# Patient Record
Sex: Male | Born: 1990 | Race: Black or African American | Hispanic: No | Marital: Single
Health system: Southern US, Community
[De-identification: ages and names within clinical notes are randomized; demographics above are authoritative.]

---

## 2021-02-10 ENCOUNTER — Other Ambulatory Visit: Payer: Self-pay

## 2021-02-10 ENCOUNTER — Emergency Department (HOSPITAL_COMMUNITY)
Admission: EM | Admit: 2021-02-10 | Discharge: 2021-02-11 | Disposition: A | Discharge status: Home / Self Care | Payer: Self-pay | Attending: Emergency Medicine | Admitting: Emergency Medicine

## 2021-02-10 ENCOUNTER — Encounter (HOSPITAL_COMMUNITY): Payer: Self-pay | Admitting: Emergency Medicine

## 2021-02-10 DIAGNOSIS — L0291 Cutaneous abscess, unspecified: Secondary | ICD-10-CM

## 2021-02-10 DIAGNOSIS — L02214 Cutaneous abscess of groin: Secondary | ICD-10-CM | POA: Insufficient documentation

## 2021-02-10 MED ORDER — HYDROCODONE-ACETAMINOPHEN 5-325 MG PO TABS
2.0000 | ORAL_TABLET | Freq: Once | ORAL | Status: AC
Start: 2021-02-10 — End: 2021-02-10
  Administered 2021-02-10: 2 via ORAL
  Filled 2021-02-10: qty 2

## 2021-02-10 MED ORDER — LIDOCAINE-EPINEPHRINE (PF) 2 %-1:200000 IJ SOLN
10.0000 mL | Freq: Once | INTRAMUSCULAR | Status: AC
  Administered 2021-02-10: 10 mL
  Filled 2021-02-10: qty 20

## 2021-02-10 NOTE — ED Triage Notes (Signed)
Pt reports a "hair bump that turned in to an abscess" on his L upper groin for about 4 days. States that he went to urgent care for same on Thursday. No fever.

## 2021-02-10 NOTE — ED Provider Notes (Signed)
MSE was initiated and I personally evaluated the patient and placed orders (if any) at  10:27 PM on February 10, 2021.  Patient with abscess in upper groin.  No fever.  No vomiting.  Has been on antibiotics prescribed by urgent care with no improvement.  Discussed with patient that their care has been initiated.   They are counseled that they will need to remain in the ED until the completion of their workup, including full H&P and results of any tests.  Risks of leaving the emergency department prior to completion of treatment were discussed. Patient was advised to inform ED staff if they are leaving before their treatment is complete. The patient acknowledged these risks and time was allowed for questions.    The patient appears stable so that the remainder of the MSE may be completed by another provider.    Roxy Horseman, PA-C 02/10/21 2227    Milagros Loll, MD 02/12/21 [phone]

## 2021-02-11 MED ORDER — OXYCODONE-ACETAMINOPHEN 5-325 MG PO TABS: 1.0000 | ORAL_TABLET | Freq: Three times a day (TID) | ORAL | 0 refills | Status: DC | PRN

## 2021-02-11 MED ORDER — DOXYCYCLINE HYCLATE 100 MG PO TABS
100.0000 mg | ORAL_TABLET | Freq: Once | ORAL | Status: AC
  Administered 2021-02-11: 100 mg via ORAL
  Filled 2021-02-11: qty 1

## 2021-02-11 MED ORDER — DOXYCYCLINE HYCLATE 100 MG PO CAPS: 100.0000 mg | ORAL_CAPSULE | Freq: Two times a day (BID) | ORAL | 0 refills | Status: AC

## 2021-02-11 MED ORDER — HYDROCODONE-ACETAMINOPHEN 5-325 MG PO TABS
1.0000 | ORAL_TABLET | Freq: Once | ORAL | Status: AC
  Administered 2021-02-11: 1 via ORAL
  Filled 2021-02-11: qty 1

## 2021-02-11 NOTE — ED Provider Notes (Signed)
[facility]-EMERGENCY DEPT Provider Note   CSN: [id number] Arrival date & time: 02/10/21  2043     History Chief Complaint  Patient presents with  . Abscess    Dakota Reed is a 30 y.o. male presenting for evaluation of groin abscess.  Patient states for the past week he has pain and swelling in his left groin.  No fevers, chills, nausea, vomiting, abdominal pain.  He was seen in urgent care several days ago, at that time it is too small to drain.  He is put on antibiotics, but states he did not like the side effects of stopped taking them.  He is not using anything including Tylenol, ibuprofen, warm compresses.  No history of similar.  He has no other medical problems, takes no medications daily.  No difficulty urinating.  HPI     History reviewed. No pertinent past medical history.  There are no problems to display for this patient.    History reviewed. No pertinent family history.     Home Medications Prior to Admission medications   Medication Sig Start Date End Date Taking? Authorizing Provider  doxycycline (VIBRAMYCIN) 100 MG capsule Take 1 capsule (100 mg total) by mouth 2 (two) times daily for 7 days. 02/11/21 02/18/21 Yes Susano Cleckler, PA-C  oxyCODONE-acetaminophen (PERCOCET/ROXICET) 5-325 MG tablet Take 1 tablet by mouth every 8 (eight) hours as needed for severe pain. 02/11/21  Yes Aujanae Mccullum, PA-C    Allergies    Patient has no known allergies.  Review of Systems   Review of Systems  Genitourinary:       Left groin pain and swelling  All other systems reviewed and are negative.   Physical Exam Updated Vital Signs BP (!) 144/87   Pulse 88   Temp 98.2 F (36.8 C) (Oral)   Resp 19   Ht 5\' 9"  (1.753 m)   Wt 68 kg   SpO2 98%   BMI 22.15 kg/m   Physical Exam Vitals and nursing note reviewed.  Constitutional:      General: He is not in acute distress.    Appearance: He is well-developed.  HENT:     Head:  Normocephalic and atraumatic.  Cardiovascular:     Rate and Rhythm: Normal rate and regular rhythm.     Pulses: Normal pulses.  Pulmonary:     Effort: Pulmonary effort is normal.     Breath sounds: Normal breath sounds.  Abdominal:     General: There is no distension.     Palpations: Abdomen is soft. There is no mass.     Tenderness: There is no abdominal tenderness. There is no guarding or rebound.  Genitourinary:      Comments: Large tender and fluctuant area of the left groin.  No tenderness palpation elsewhere in the lower abdomen or upper thigh.  No crepitus. Musculoskeletal:        General: Normal range of motion.     Cervical back: Normal range of motion.  Skin:    General: Skin is warm.     Capillary Refill: Capillary refill takes less than 2 seconds.     Findings: No rash.  Neurological:     Mental Status: He is alert and oriented to person, place, and time.     ED Results / Procedures / Treatments   Labs (all labs ordered are listed, but only abnormal results are displayed) Labs Reviewed - No data to display  EKG None  Radiology No results found.  Procedures .Marland KitchenIncision and Drainage  Date/Time: 02/11/2021 12:32 AM Performed by: Alveria Apley, PA-C Authorized by: Alveria Apley, PA-C   Consent:    Consent obtained:  Verbal   Consent given by:  Patient   Risks discussed:  Bleeding, incomplete drainage, pain, infection and damage to other organs Location:    Type:  Abscess   Location:  Anogenital   Anogenital location: Left inguinal area. Pre-procedure details:    Skin preparation:  Antiseptic wash Anesthesia:    Anesthesia method:  Local infiltration   Local anesthetic:  Lidocaine 2% WITH epi Procedure type:    Complexity:  Simple Procedure details:    Incision types:  Single straight   Incision depth:  Dermal   Wound management:  Irrigated with saline and probed and deloculated   Drainage:  Purulent   Drainage amount:  Moderate   Wound  treatment:  Wound left open   Packing materials:  None Post-procedure details:    Procedure completion:  Tolerated well, no immediate complications     Medications Ordered in ED Medications  lidocaine-EPINEPHrine (XYLOCAINE W/EPI) 2 %-1:200000 (PF) injection 10 mL (10 mLs Infiltration Given by Other 02/10/21 2250)  HYDROcodone-acetaminophen (NORCO/VICODIN) 5-325 MG per tablet 2 tablet (2 tablets Oral Given 02/10/21 2250)  doxycycline (VIBRA-TABS) tablet 100 mg (100 mg Oral Given 02/11/21 0022)  HYDROcodone-acetaminophen (NORCO/VICODIN) 5-325 MG per tablet 1 tablet (1 tablet Oral Given 02/11/21 0023)    ED Course  I have reviewed the triage vital signs and the nursing notes.  Pertinent labs & imaging results that were available during my care of the patient were reviewed by me and considered in my medical decision making (see chart for details).    MDM Rules/Calculators/A&P                          Patient presented for evaluation of left inguinal abscess.  On exam, patient appears nontoxic.  No signs of systemic infection or sepsis.  Exam is not consistent with Fournier's gangrene.  Area evaluated with bedside ultrasound, no obvious tracking.  I&D performed as described above, aftercare instructions given.  As there is significant purulent drainage, will place on antibiotics.  At this time, patient appears safe for discharge.  Return precautions given.  Patient states he understands and agrees to plan  Final Clinical Impression(s) / ED Diagnoses Final diagnoses:  Abscess    Rx / DC Orders ED Discharge Orders         Ordered    doxycycline (VIBRAMYCIN) 100 MG capsule  2 times daily        02/11/21 0011    oxyCODONE-acetaminophen (PERCOCET/ROXICET) 5-325 MG tablet  Every 8 hours PRN        02/11/21 0011           Tonga Prout, PA-C 02/11/21 0034    Melene Plan, DO 02/11/21 0423

## 2021-02-11 NOTE — Discharge Instructions (Addendum)
Take antibiotics as prescribed.  Take entire course, even if symptoms improve. Use warm compresses 3 times a day for pain and swelling. Use Tylenol ibuprofen as needed for mild to moderate pain.  Use Percocet as needed for severe breakthrough pain.  Have caution, this may make you tired or groggy.  Do not drive or operate heavy machinery while take this medicine.  Do not squeeze or poke at the area, as this may cause worsening symptoms. Return to the emergency room if you develop high fevers, severe worsening pain, increased pus draining from the area, or any new, worsening, or concerning symptoms.

## 2021-03-25 ENCOUNTER — Telehealth (HOSPITAL_COMMUNITY): Payer: Self-pay | Admitting: Emergency Medicine

## 2021-03-25 ENCOUNTER — Encounter (HOSPITAL_COMMUNITY): Payer: Self-pay | Admitting: Emergency Medicine

## 2021-03-25 ENCOUNTER — Other Ambulatory Visit: Payer: Self-pay

## 2021-03-25 ENCOUNTER — Emergency Department (HOSPITAL_COMMUNITY): Payer: Self-pay

## 2021-03-25 ENCOUNTER — Emergency Department (HOSPITAL_COMMUNITY)
Admission: EM | Admit: 2021-03-25 | Discharge: 2021-03-25 | Disposition: A | Discharge status: Home / Self Care | Payer: Self-pay | Attending: Emergency Medicine | Admitting: Emergency Medicine

## 2021-03-25 DIAGNOSIS — L03317 Cellulitis of buttock: Secondary | ICD-10-CM | POA: Insufficient documentation

## 2021-03-25 LAB — CBC WITH DIFFERENTIAL/PLATELET
Abs Immature Granulocytes: 0.01 10*3/uL (ref 0.00–0.07)
Basophils Absolute: 0 10*3/uL (ref 0.0–0.1)
Basophils Relative: 0 %
Eosinophils Absolute: 0 10*3/uL (ref 0.0–0.5)
Eosinophils Relative: 1 %
HCT: 45.1 % (ref 39.0–52.0)
Hemoglobin: 15 g/dL (ref 13.0–17.0)
Immature Granulocytes: 0 %
Lymphocytes Relative: 13 %
Lymphs Abs: 1 10*3/uL (ref 0.7–4.0)
MCH: 30.9 pg (ref 26.0–34.0)
MCHC: 33.3 g/dL (ref 30.0–36.0)
MCV: 92.8 fL (ref 80.0–100.0)
Monocytes Absolute: 0.5 10*3/uL (ref 0.1–1.0)
Monocytes Relative: 7 %
Neutro Abs: 6.4 10*3/uL (ref 1.7–7.7)
Neutrophils Relative %: 79 %
Platelets: 14 10*3/uL — CL (ref 150–400)
RBC: 4.86 MIL/uL (ref 4.22–5.81)
RDW: 12.5 % (ref 11.5–15.5)
WBC: 8 10*3/uL (ref 4.0–10.5)
nRBC: 0 % (ref 0.0–0.2)

## 2021-03-25 LAB — BASIC METABOLIC PANEL
Anion gap: 4 — ABNORMAL LOW (ref 5–15)
BUN: 11 mg/dL (ref 6–20)
CO2: 29 mmol/L (ref 22–32)
Calcium: 8.8 mg/dL — ABNORMAL LOW (ref 8.9–10.3)
Chloride: 104 mmol/L (ref 98–111)
Creatinine, Ser: 1.3 mg/dL — ABNORMAL HIGH (ref 0.61–1.24)
GFR, Estimated: >60 mL/min (ref >60)
Glucose, Bld: 84 mg/dL (ref 70–99)
Potassium: 4.1 mmol/L (ref 3.5–5.1)
Sodium: 137 mmol/L (ref 135–145)

## 2021-03-25 MED ORDER — SULFAMETHOXAZOLE-TRIMETHOPRIM 800-160 MG PO TABS: 1.0000 | ORAL_TABLET | Freq: Two times a day (BID) | ORAL | 0 refills | Status: AC

## 2021-03-25 MED ORDER — MORPHINE SULFATE (PF) 4 MG/ML IV SOLN
4.0000 mg | Freq: Once | INTRAVENOUS | Status: AC
  Administered 2021-03-25: 4 mg via INTRAVENOUS
  Filled 2021-03-25: qty 1

## 2021-03-25 MED ORDER — SULFAMETHOXAZOLE-TRIMETHOPRIM 800-160 MG PO TABS
1.0000 | ORAL_TABLET | Freq: Once | ORAL | Status: AC
  Administered 2021-03-25: 1 via ORAL
  Filled 2021-03-25: qty 1

## 2021-03-25 MED ORDER — HYDROCODONE-ACETAMINOPHEN 5-325 MG PO TABS: 1.0000 | ORAL_TABLET | Freq: Three times a day (TID) | ORAL | 0 refills | Status: AC | PRN

## 2021-03-25 MED ORDER — HYDROCODONE-ACETAMINOPHEN 5-325 MG PO TABS
1.0000 | ORAL_TABLET | Freq: Once | ORAL | Status: DC
  Filled 2021-03-25: qty 1

## 2021-03-25 MED ORDER — HYDROCODONE-ACETAMINOPHEN 5-325 MG PO TABS: 2.0000 | ORAL_TABLET | ORAL | 0 refills | Status: AC | PRN

## 2021-03-25 NOTE — ED Provider Notes (Signed)
[facility]-EMERGENCY DEPT Provider Note  CSN: [id number] Arrival date & time: 03/25/21 0056  Chief Complaint(s) Abscess  HPI Dakota Reed is a 30 y.o. male   The history is provided by the patient.  Abscess Location:  Pelvis Pelvic abscess location:  L buttock Abscess quality: draining, induration and painful   Red streaking: no   Duration:  2 days Progression:  Worsening Pain details:    Quality:  Throbbing   Severity:  Moderate   Timing:  Constant Context: not diabetes, not immunosuppression, not injected drug use, not insect bite/sting and not skin injury   Relieved by:  Nothing Worsened by:  Nothing Risk factors: prior abscess (had left inguinal abscess drained last month)   Risk factors: no family hx of MRSA     Past Medical History History reviewed. No pertinent past medical history. There are no problems to display for this patient.  Home Medication(s) Prior to Admission medications   Medication Sig Start Date End Date Taking? Authorizing Provider  HYDROcodone-acetaminophen (NORCO/VICODIN) 5-325 MG tablet Take 1 tablet by mouth every 8 (eight) hours as needed for up to 3 days for severe pain (That is not improved by your scheduled acetaminophen regimen). Please do not exceed 4000 mg of acetaminophen (Tylenol) a 24-hour period. Please note that he may be prescribed additional medicine that contains acetaminophen. 03/25/21 03/28/21 Yes Avian Konigsberg, Amadeo Garnet, MD  sulfamethoxazole-trimethoprim (BACTRIM DS) 800-160 MG tablet Take 1 tablet by mouth 2 (two) times daily for 7 days. 03/25/21 04/01/21 Yes Nazeer Romney, Amadeo Garnet, MD                                                                                                                                    Past Surgical History History reviewed. No pertinent surgical history. Family History History reviewed. No pertinent family history.  Social History   Allergies Patient has no known  allergies.  Review of Systems Review of Systems  Genitourinary: Positive for scrotal swelling. Negative for penile discharge, penile pain, penile swelling and testicular pain.   All other systems are reviewed and are negative for acute change except as noted in the HPI  Physical Exam Vital Signs  I have reviewed the triage vital signs BP 103/66   Pulse 64   Temp 98.6 F (37 C) (Oral)   Resp 16   SpO2 95%   Physical Exam Vitals reviewed.  Constitutional:      General: He is not in acute distress.    Appearance: He is well-developed. He is not diaphoretic.  HENT:     Head: Normocephalic and atraumatic.     Jaw: No trismus.     Right Ear: External ear normal.     Left Ear: External ear normal.     Nose: Nose normal.  Eyes:     General: No scleral icterus.    Conjunctiva/sclera: Conjunctivae normal.  Neck:     Trachea:  Phonation normal.  Cardiovascular:     Rate and Rhythm: Normal rate and regular rhythm.  Pulmonary:     Effort: Pulmonary effort is normal. No respiratory distress.     Breath sounds: No stridor.  Abdominal:     General: There is no distension.  Genitourinary:    Penis: Circumcised.        Comments: Scrotal edema. No TTP. No erythema. Musculoskeletal:        General: Normal range of motion.     Cervical back: Normal range of motion.  Neurological:     Mental Status: He is alert and oriented to person, place, and time.  Psychiatric:        Behavior: Behavior normal.     ED Results and Treatments Labs (all labs ordered are listed, but only abnormal results are displayed) Labs Reviewed  CBC WITH DIFFERENTIAL/PLATELET - Abnormal; Notable for the following components:      Result Value   Platelets 14 (*)    All other components within normal limits  BASIC METABOLIC PANEL - Abnormal; Notable for the following components:   Creatinine, Ser 1.30 (*)    Calcium 8.8 (*)    Anion gap 4 (*)    All other components within normal limits                                                                                                                          EKG  EKG Interpretation  Date/Time:    Ventricular Rate:    PR Interval:    QRS Duration:   QT Interval:    QTC Calculation:   R Axis:     Text Interpretation:        Radiology CT ABDOMEN PELVIS WO CONTRAST  Result Date: 03/25/2021 CLINICAL DATA:  Evaluate perianal abscess EXAM: CT ABDOMEN AND PELVIS WITHOUT CONTRAST TECHNIQUE: Multidetector CT imaging of the abdomen and pelvis was performed following the standard protocol without IV contrast. COMPARISON:  None. FINDINGS: Lower chest: No acute abnormality. Hepatobiliary: No focal liver abnormality is seen. No gallstones, gallbladder wall thickening, or biliary dilatation. Pancreas: Unremarkable. No pancreatic ductal dilatation or surrounding inflammatory changes. Spleen: Normal in size without focal abnormality. Adrenals/Urinary Tract: Adrenal glands are unremarkable. Kidneys are normal, without renal calculi, focal lesion, or hydronephrosis. Bladder is unremarkable. Stomach/Bowel: Stomach is within normal limits. Appendix appears normal. No evidence of bowel wall thickening, distention, or inflammatory changes. Vascular/Lymphatic: No significant vascular findings are present. No enlarged abdominal or pelvic lymph nodes. Prominent left inguinal lymph nodes are identified measuring up to 9 mm, image 77/2. Reproductive: Prostate is unremarkable. Other: There is subcutaneous fat stranding with overlying skin thickening involving the left gluteal fold compatible with cellulitis. No underlying fluid collection. There is also mild subcutaneous fat stranding and skin thickening involving the left inguinal region. Musculoskeletal: No acute or significant osseous findings. IMPRESSION: 1. Examination is positive for cellulitis involving the left gluteal fold and left inguinal region. No underlying fluid collection  identified. 2. Prominent left inguinal  lymph nodes are likely reactive. Electronically Signed   By: Signa Kell M.D.   On: 03/25/2021 05:17    Pertinent labs & imaging results that were available during my care of the patient were reviewed by me and considered in my medical decision making (see chart for details).  Medications Ordered in ED Medications  HYDROcodone-acetaminophen (NORCO/VICODIN) 5-325 MG per tablet 1 tablet (1 tablet Oral Patient Refused/Not Given 03/25/21 0704)  sulfamethoxazole-trimethoprim (BACTRIM DS) 800-160 MG per tablet 1 tablet (has no administration in time range)  morphine 4 MG/ML injection 4 mg (4 mg Intravenous Given 03/25/21 0446)                                                                                                                                    Procedures Procedures  (including critical care time)  Medical Decision Making / ED Course I have reviewed the nursing notes for this encounter and the patient's prior records (if available in EHR or on provided paperwork).   Dakota Reed was evaluated in Emergency Department on 03/25/2021 for the symptoms described in the history of present illness. He was evaluated in the context of the global COVID-19 pandemic, which necessitated consideration that the patient might be at risk for infection with the SARS-CoV-2 virus that causes COVID-19. Institutional protocols and algorithms that pertain to the evaluation of patients at risk for COVID-19 are in a state of rapid change based on information released by regulatory bodies including the CDC and federal and state organizations. These policies and algorithms were followed during the patient's care in the ED.  Work-up reassuring revealing cellulitis of the left buttock and inguinal region.  No need for I&D.  No evidence of Fournier's. No leukocytosis on CBC.  We did note thrombocytopenia.  Patient's mother on the phone stating that he has had a history of thrombocytopenia since a child. Patient  appropriate for outpatient management.  Will prescribe Bactrim      Final Clinical Impression(s) / ED Diagnoses Final diagnoses:  Cellulitis of left buttock   The patient appears reasonably screened and/or stabilized for discharge and I doubt any other medical condition or other [facility] requiring further screening, evaluation, or treatment in the ED at this time prior to discharge. Safe for discharge with strict return precautions.  Disposition: Discharge  Condition: Good  I have discussed the results, Dx and Tx plan with the patient/family who expressed understanding and agree(s) with the plan. Discharge instructions discussed at length. The patient/family was given strict return precautions who verbalized understanding of the instructions. No further questions at time of discharge.    ED Discharge Orders         Ordered    HYDROcodone-acetaminophen (NORCO/VICODIN) 5-325 MG tablet  Every 8 hours PRN        03/25/21 0715    sulfamethoxazole-trimethoprim (BACTRIM DS) 800-160 MG tablet  2 times daily  03/25/21 0716          [facility] narcotic database reviewed and  no active prescriptions noted.   Follow Up: Primary care provider  Call  to schedule an appointment for close follow up, in 3-5 days      This chart was dictated using voice recognition software.  Despite best efforts to proofread,  errors can occur which can change the documentation meaning.   Nira Connardama, Soma Lizak Eduardo, MD 03/25/21 [phone]

## 2021-03-25 NOTE — Telephone Encounter (Signed)
Rec call from pharmacy [facility] [phone] - Patient was unable to fill script for norco at original pharmacy selected. Script resent to [facility] # [id number].

## 2021-03-25 NOTE — ED Notes (Signed)
Critical lab platelets 14,  MD notified

## 2021-03-25 NOTE — ED Triage Notes (Signed)
Pt reports an abscess in his L inner buttocks. States that he recently had one drained in his groin about a month ago. No fever.

## 2021-03-25 NOTE — ED Notes (Signed)
Pt is refusing pain meds, and being verbally aggressive. Complaining that the provider did not give him any pain meds that would help him. Provider aware

## 2022-05-28 IMAGING — CT CT ABD-PELV W/O CM
2 of 4 series · 16 of 46 positions shown, 18 images · non-contrast
Comparison: None.

CLINICAL DATA: Evaluate perianal abscess

EXAM:
CT ABDOMEN AND PELVIS WITHOUT CONTRAST
TECHNIQUE: Multidetector CT imaging of the abdomen and pelvis was performed
following the standard protocol without IV contrast.

[Series 2: axial st · axial · 0.76mm/px · z∈[-504,-94]mm · 13 of 92 slices shown, 15 images]
[im 5/92  soft-tissue]
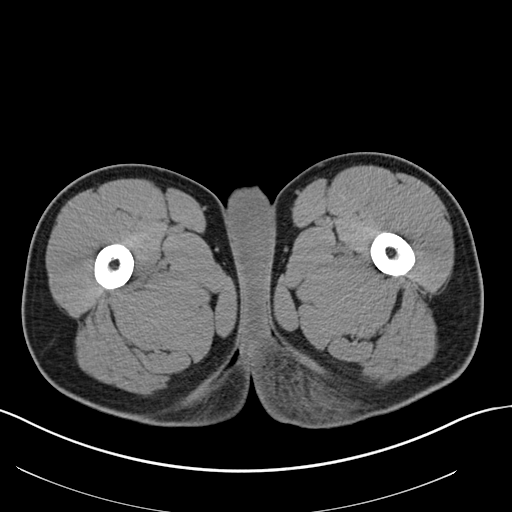
[im 5/92  bone]
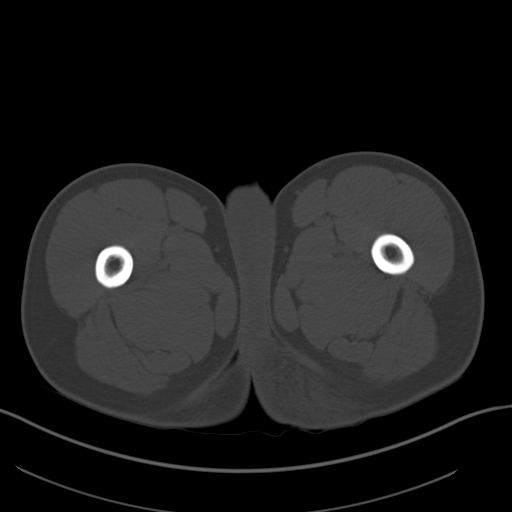
[im 14/92  soft-tissue]
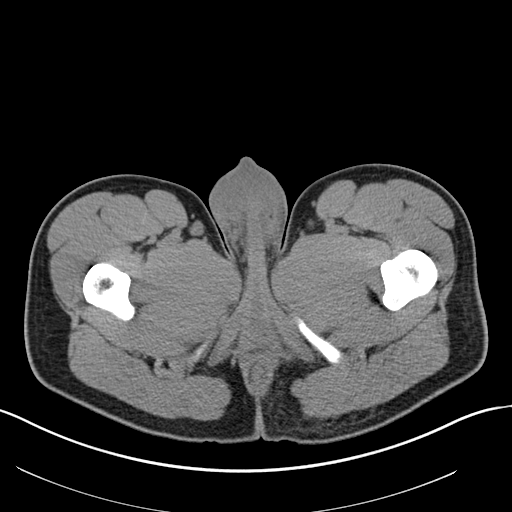
[im 19/92  soft-tissue]
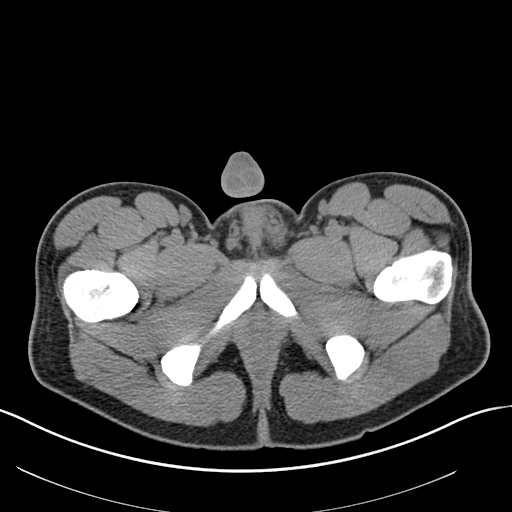
[im 28/92  soft-tissue]
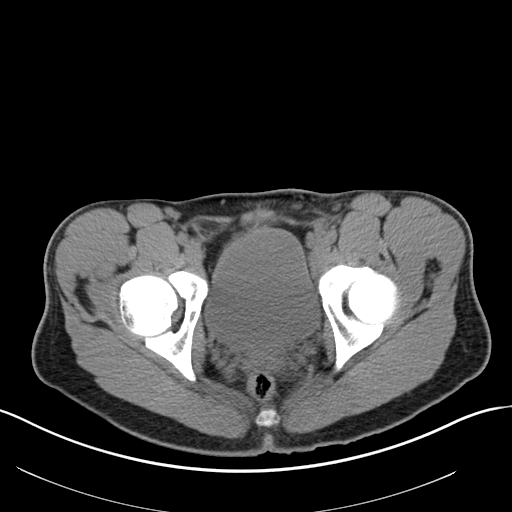
[im 32/92  soft-tissue]
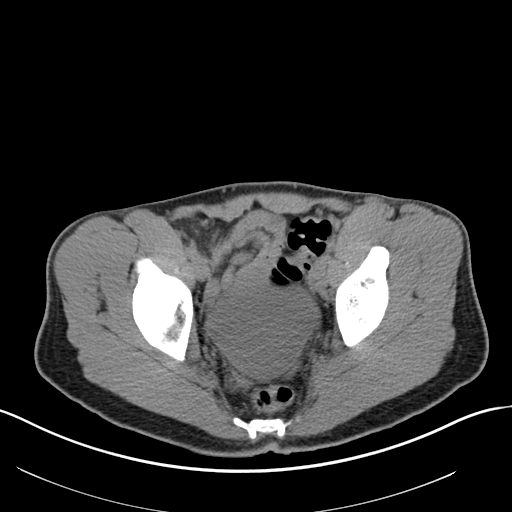
[im 41/92  soft-tissue]
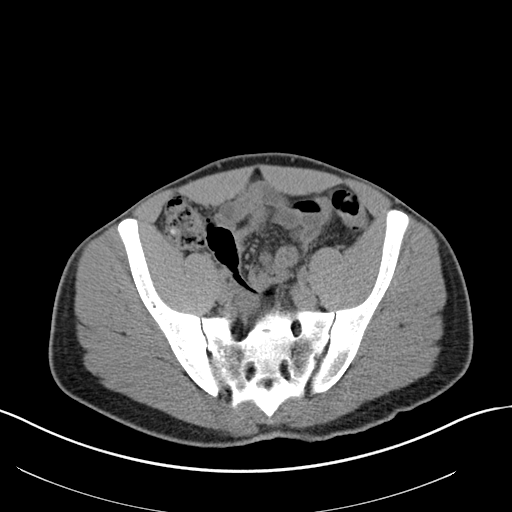
[im 46/92  soft-tissue]
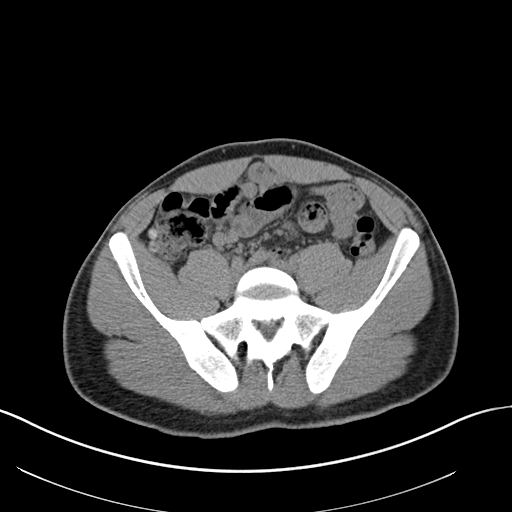
[im 51/92  soft-tissue]
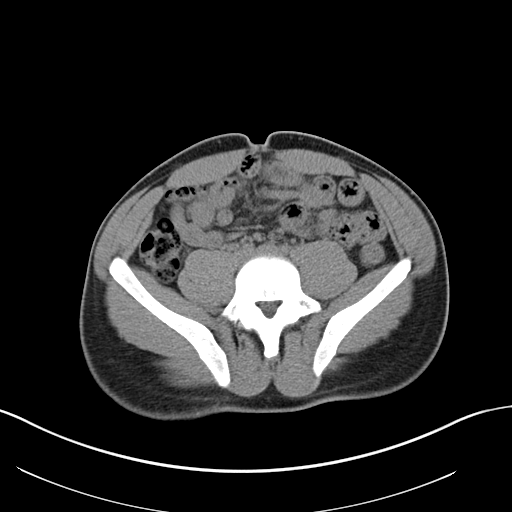
[im 60/92  soft-tissue]
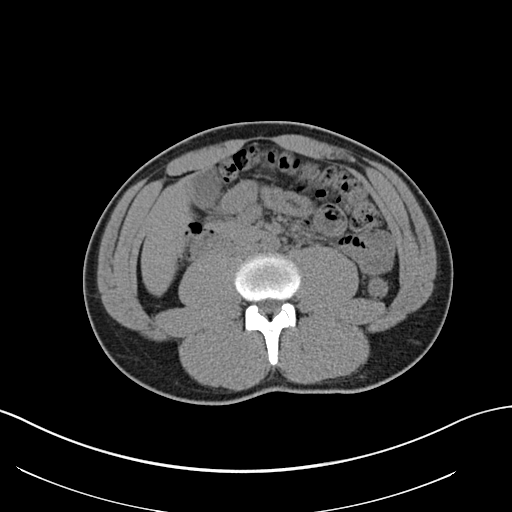
[im 60/92  bone]
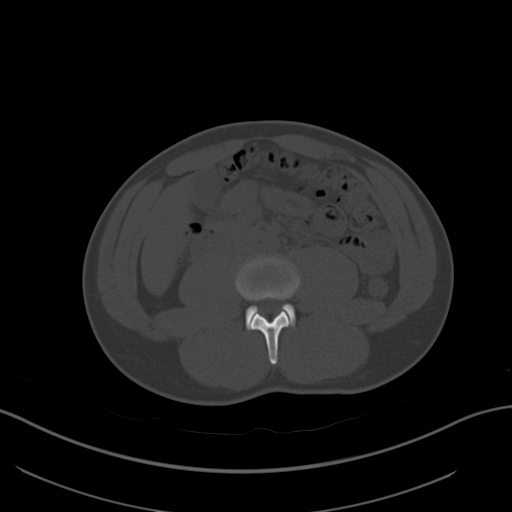
[im 64/92  soft-tissue]
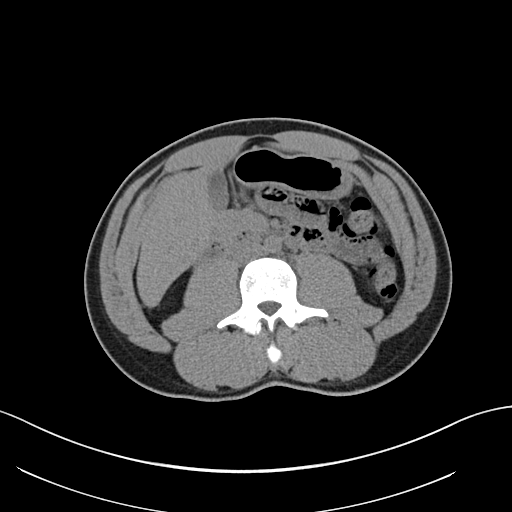
[im 73/92  soft-tissue]
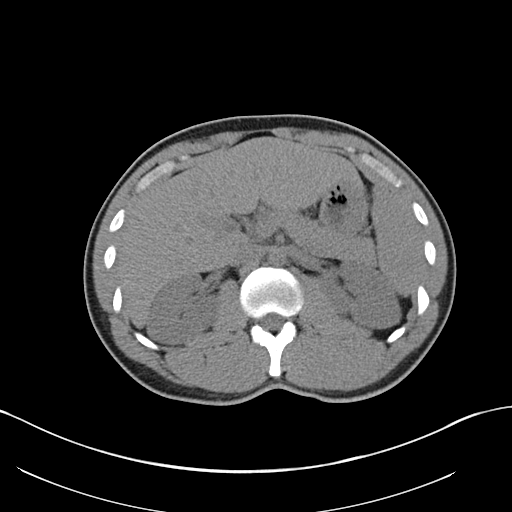
[im 78/92  soft-tissue]
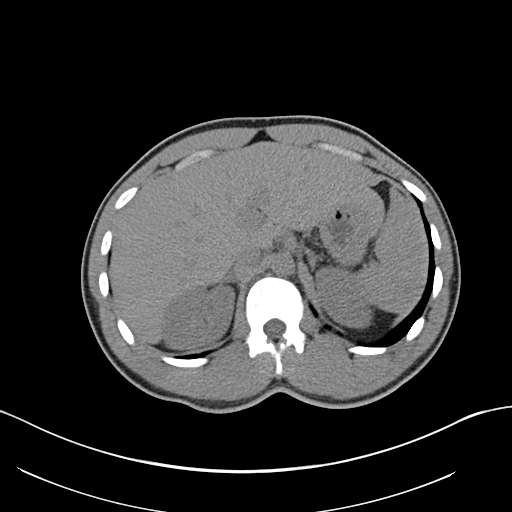
[im 87/92  soft-tissue]
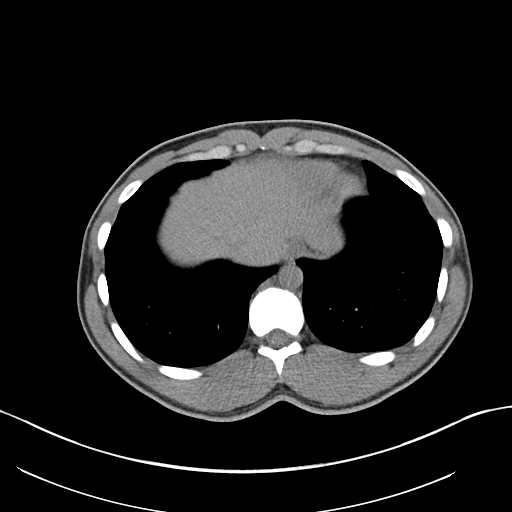

[Series 5: coronal st · coronal · 0.72mm/px · 3 of 150 slices shown]
[im 50/150  soft-tissue]
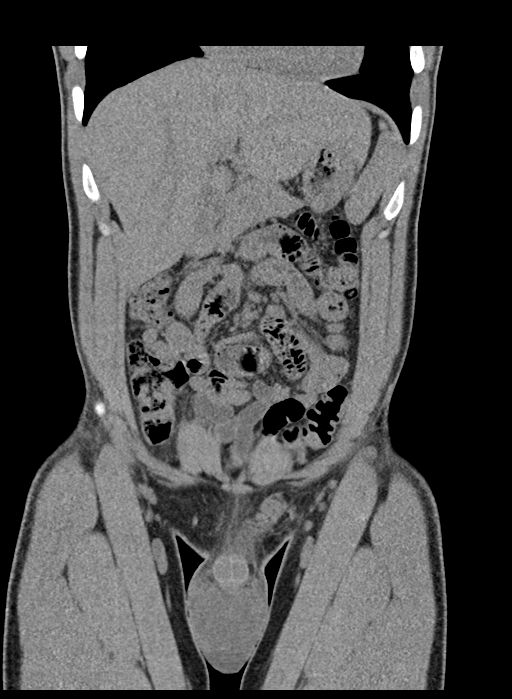
[im 67/150  soft-tissue]
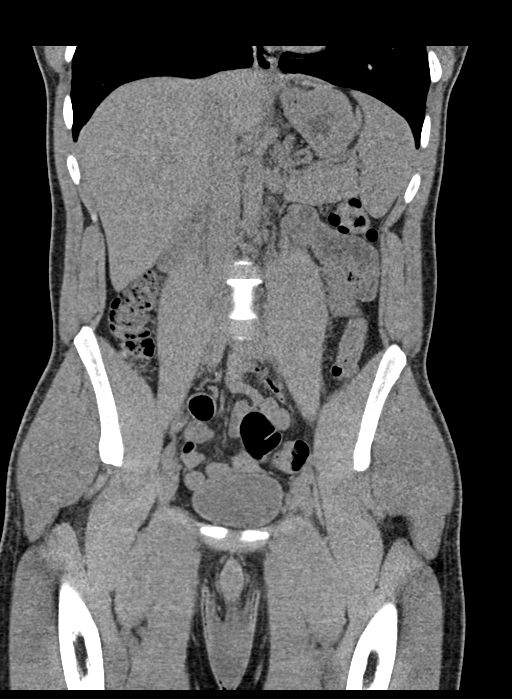
[im 83/150  soft-tissue]
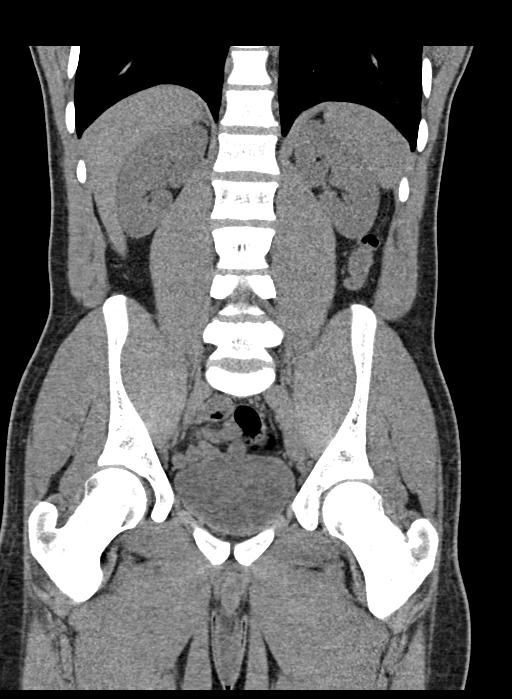

[16 of 46 positions shown; findings below may reference images not displayed]

FINDINGS: Lower chest: No acute abnormality.

Hepatobiliary: No focal liver abnormality is seen. No gallstones,
gallbladder wall thickening, or biliary dilatation.

Pancreas: Unremarkable. No pancreatic ductal dilatation or
surrounding inflammatory changes.

Spleen: Normal in size without focal abnormality.

Adrenals/Urinary Tract: Adrenal glands are unremarkable. Kidneys are
normal, without renal calculi, focal lesion, or hydronephrosis.
Bladder is unremarkable.

Stomach/Bowel: Stomach is within normal limits. Appendix appears
normal. No evidence of bowel wall thickening, distention, or
inflammatory changes.

Vascular/Lymphatic: No significant vascular findings are present. No
enlarged abdominal or pelvic lymph nodes. Prominent left inguinal
lymph nodes are identified measuring up to 9 mm, image 77/2.

Reproductive: Prostate is unremarkable.

Other: There is subcutaneous fat stranding with overlying skin
thickening involving the left gluteal fold compatible with
cellulitis. No underlying fluid collection. There is also mild
subcutaneous fat stranding and skin thickening involving the left
inguinal region.

Musculoskeletal: No acute or significant osseous findings.
IMPRESSION: 1. Examination is positive for cellulitis involving the left gluteal
fold and left inguinal region. No underlying fluid collection
identified.
2. Prominent left inguinal lymph nodes are likely reactive.
